# Patient Record
Sex: Female | Born: 1980 | Hispanic: Yes | Marital: Single | Smoking: Former smoker
Health system: Northeastern US, Academic
[De-identification: ages and names within clinical notes are randomized; demographics above are authoritative.]

---

## 2020-11-24 ENCOUNTER — Other Ambulatory Visit

## 2020-11-25 ENCOUNTER — Encounter (HOSPITAL_BASED_OUTPATIENT_CLINIC_OR_DEPARTMENT_OTHER): Admitting: Ophthalmology

## 2020-11-25 ENCOUNTER — Ambulatory Visit: Admit: 2020-11-25 | Payer: MEDICARE

## 2020-11-25 ENCOUNTER — Ambulatory Visit: Admit: 2020-11-25 | Payer: MEDICARE | Attending: Ophthalmology

## 2020-11-25 DIAGNOSIS — H5713 Ocular pain, bilateral: Secondary | ICD-10-CM

## 2020-11-25 MED ORDER — loteprednol (Lotemax) 0.5 % ophthalmic suspension
0.5 | Freq: Four times a day (QID) | OPHTHALMIC | 3 refills | 25.00000 days | Status: AC
Start: 2020-11-25 — End: 2021-11-25

## 2020-11-25 NOTE — Progress Notes (Signed)
Pain of both eyes  Self-referred, patient of Dr. Ronn Melena [facility], [place])  Symptoms of "light burning" OS>OD - started 1 year after LASIK OU (02/2019, initially just had dryness afterward) around the same time she was diagnosed with Lyme disease (2021), still on treatment with ceftriaxone. Also has daily headaches, facial pain left>right. Has had some improvement. Symptoms are worse at night time/while driving    Drop testing:  Baseline OD 2, OS 4  NACL 20 sec: OD 4, OS 5  Baseline before proparacaine: OD 4, OS 4  Proparacaine 90 sec: FB sensation, not stinging OD, OS 0    Previous treatments:  IPL x 5 - helped significantly  ATs - no significant improvement  Xiidra - no improvement, d/c'd by Dr. Altamese Cabal  Restasis - no improvement, d/c'd by Dr. 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  "Q" - helped   Gabapentin - previously on 800 mg TID, now on 300 mg TID with improvement  Planning to start LDN    HRT 11/25/20: decreased nerve density OD>OS, many inflammatory cells, large microneuroma OS    Allergies to grass, dust mites    ROS:  [x]  Numbness of fingers, hands, feet  [x]  Tingling of hands, feet, fingers  [x]  Ringing in the ears - previously, now resolved  [x]  Dizziness when getting up in the morning, or standing up  []  Blue fingertips or toes  [x]  Dry skin  []  Dry mouth or dry tongue  []  Problems with sweating  []  Bowel issues  [x]  Anxiety  []  Allodynia   [x]  Family history of autoimmune disease - sister with Sjogren's  []  Personal history of autoimmune disease - recent labs show RF negative, elevated B6, low vitamin D, Lyme positive     Plan for treatment:  - Send serologies  - Start Pataday or Pazeo daily OU  - Start Refresh PM or other lubricating ointment at bedtime OU   - Start Lotemax - apply one drop into each eye 4 times daily for  2 weeks, then decrease to 2 times daily for 2 weeks, then decrease and stay on once daily until follow up visit   - Recommend covers on bedding for dust mites, getting rid of carpet or plastic mat under bed posts  - Suggest adding LDN before stopping gabapentin    Recommend trial of meds x 6 weeks, then call to discuss symptoms and further treatment options (consider AST at that time)All diagnoses were discussed in detail with the patient including the details of today's exam and testing, treatment options, prognosis, and need for follow up as directed.  Importance of follow up and following prescribed medicines and directions we discussed. Risk of medications were reviewed as well as the importance to follow up to prevent vision loss. All questions were answered and patient expressed understanding. Patient to call with any new concerns.More than 50% of 60-min appt time spent face-to-face discussing diagnosis and treatment.      RV 2-3 months

## 2020-11-28 ENCOUNTER — Other Ambulatory Visit (HOSPITAL_BASED_OUTPATIENT_CLINIC_OR_DEPARTMENT_OTHER)

## 2020-11-28 MED ORDER — loteprednol etabonate (Lotemax SM) 0.38 % drops,gel
0.38 | Freq: Four times a day (QID) | OPHTHALMIC | 3 refills | 25.00000 days | Status: AC
Start: 2020-11-28 — End: ?

## 2020-11-28 NOTE — Telephone Encounter (Signed)
Mason Jim, MD  Veleta Miners  Cc: [facility] Rx Refill Request Pool  Hi   We can go with Lotemax SM. Same instructions   Thank you       ?     Previous Messages    ?  ----- Message -----   From: Veleta Miners   Sent: 11/28/2020 ?11:30 AM EDT   To: Mason Jim, MD, *   Subject: lotemax denied ? ? ? ? ? ? ? ? ? ? ? ? ? ? ?     Dr Tressia Danas,     I submitted a PA to both of her prescription drug insurances. ?([employer] and [employer]). ?Both denied the drug. ?The covered alternatives are listed below. ?If you need to appeal the decision, please send [place] a letter of medical necessity explaining why none of the alternatives are acceptable.       Lotemax SM   Pred forte 1%   Pred mild 0.12%   Dexamethasone 0.1%   FML 0.1%   FML liquifilm 0.25%   Diclofenac sodium   Tobradex     If you would like one of these alternatives, please specify dosing instructions.

## 2021-01-13 ENCOUNTER — Encounter (HOSPITAL_BASED_OUTPATIENT_CLINIC_OR_DEPARTMENT_OTHER): Admitting: Ophthalmology

## 2021-01-13 NOTE — Telephone Encounter (Signed)
 From: Jacinto Halim  To: Dr. Mason Jim  Sent: 01/13/2021 1:40 AM EDT  Subject: You wanted me to contact you at 6 weeks for update     Hi, my eyes are better but I do have little burn. Some days none but some days very little. My eyes have gotten better but still very little burning sometimes

## 2021-03-03 ENCOUNTER — Ambulatory Visit: Payer: MEDICARE | Attending: Ophthalmology

## 2021-03-13 ENCOUNTER — Ambulatory Visit: Payer: MEDICARE | Attending: Ophthalmology

## 2021-04-28 ENCOUNTER — Ambulatory Visit (HOSPITAL_BASED_OUTPATIENT_CLINIC_OR_DEPARTMENT_OTHER): Admitting: Ophthalmology

## 2021-04-28 ENCOUNTER — Other Ambulatory Visit: Admit: 2021-04-28 | Payer: MEDICARE

## 2021-04-28 ENCOUNTER — Other Ambulatory Visit

## 2021-04-28 ENCOUNTER — Ambulatory Visit: Admit: 2021-04-28 | Discharge: 2021-04-28 | Payer: MEDICARE | Attending: Ophthalmology

## 2021-04-28 ENCOUNTER — Encounter

## 2021-04-28 ENCOUNTER — Encounter (HOSPITAL_BASED_OUTPATIENT_CLINIC_OR_DEPARTMENT_OTHER): Admitting: Ophthalmology

## 2021-04-28 DIAGNOSIS — H5713 Ocular pain, bilateral: Secondary | ICD-10-CM

## 2021-04-28 LAB — IGG, IGA, IGM
IgA: 256 mg/dL (ref 70.0–400.0)
IgG: 1149 mg/dL (ref 700–1600)
IgM: 99 mg/dL (ref 40–230)

## 2021-04-28 LAB — VITAMIN B12: Vitamin B-12: 692 pg/mL (ref 193–986)

## 2021-04-28 LAB — IGE: IgE, total: 31 [IU]/mL (ref 0.0–100.0)

## 2021-04-28 LAB — RHEUMATOID FACTOR: Rheumatoid Factor: <15 [IU]/mL (ref <=15)

## 2021-04-28 LAB — ALBUMIN: Albumin: 4.4 g/dL (ref 3.2–5.0)

## 2021-04-28 LAB — PROTEIN, TOTAL: Protein, total: 7.8 g/dL (ref 6.0–8.4)

## 2021-04-28 LAB — C-REACTIVE PROTEIN: CRP: 11.05 mg/L — ABNORMAL HIGH (ref 0.00–7.48)

## 2021-04-28 LAB — HEMOGLOBIN A1C: HEMOGLOBIN A1C % (INT/EXT): 5 % (ref <5.6)

## 2021-04-28 NOTE — Progress Notes (Signed)
 Pain of both eyes  Self-referred, patient of Dr. Ronn Melena [facility], [place])  Symptoms of "light burning" OS>OD - started 1 year after LASIK OU (02/2019, initially just had dryness afterward) around the same time she was diagnosed with Lyme disease (2021), still on treatment with ceftriaxone. Also has daily headaches, facial pain left>right. Has had some improvement. Symptoms are worse at night time/while driving    Drop testing:  Baseline OD 2, OS 4  NACL 20 sec: OD 4, OS 5  Baseline before proparacaine: OD 4, OS 4  Proparacaine 90 sec: FB sensation, not stinging OD, OS 0    Previous treatments:  IPL x 5 - helped significantly  ATs - no significant improvement  Xiidra - no improvement, d/c'd by Dr. Altamese Cabal  Restasis - no improvement, d/c'd by Dr. 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  "Q" - helped   Gabapentin - previously on 800 mg TID, now on 300 mg TID with improvement  Planning to start LDN    HRT 11/25/20: decreased nerve density OD>OS, many inflammatory cells, large microneuroma OS    Allergies to grass, dust mites    ROS:  [x]  Numbness of fingers, hands, feet  [x]  Tingling of hands, feet, fingers  [x]  Ringing in the ears - previously, now resolved  [x]  Dizziness when getting up in the morning, or standing up  []  Blue fingertips or toes  [x]  Dry skin  []  Dry mouth or dry tongue  []  Problems with sweating  []  Bowel issues  [x]  Anxiety  []  Allodynia   [x]  Family history of autoimmune disease - sister with Sjogren's  []  Personal history of autoimmune disease - recent labs show RF negative, elevated B6, low vitamin D, Lyme positive     Plan for treatment:  - Send serologies  - Start Pataday or Pazeo daily OU  - Start Refresh PM or other lubricating ointment at bedtime OU   - Start Lotemax - apply one drop into each eye 4 times daily for  2 weeks, then decrease to 2 times daily for 2 weeks, then decrease and stay on once daily until follow up visit   - Recommend covers on bedding for dust mites, getting rid of carpet or plastic mat under bed posts  - Suggest adding LDN before stopping gabapentin    Recommend trial of meds x 6 weeks, then call to discuss symptoms and further treatment options (consider AST at that time)All diagnoses were discussed in detail with the patient including the details of today's exam and testing, treatment options, prognosis, and need for follow up as directed.  Importance of follow up and following prescribed medicines and directions we discussed. Risk of medications were reviewed as well as the importance to follow up to prevent vision loss. All questions were answered and patient expressed understanding. Patient to call with any new concerns.More than 50% of 60-min appt time spent face-to-face discussing diagnosis and treatment.      RV 2-3 months    04/28/21  Pain overall improved around 50%. Has had 3 days with zero pain. Also had a flare-up 2 days ago. Pain is now on and off.    Plan for treatment:  - Send serologies  - cont Pataday daily OU  - cont Refresh PM   -  cont Lotemax QOD  - cont covers on bedding for dust mites  F/U 6 months

## 2021-04-28 NOTE — Addendum Note (Signed)
 Addended byOtho Ket on: 04/28/2021 03:17 PM     Modules accepted: Orders

## 2021-04-29 LAB — PROTEIN ELECTROPHORESIS, SERUM
Albumin (SPEP): 4.49 g/dL (ref 3.50–5.10)
Alpha 1 (SPEP): 0.26 g/dL (ref 0.10–0.30)
Alpha 2 (SPEP): 0.91 g/dL (ref 0.50–1.00)
Beta (SPEP): 1.06 g/dL (ref 0.50–1.10)
Gamma Globulin (SPEP): 1.08 g/dL (ref 0.50–1.30)

## 2021-04-29 LAB — ANA (ANTINUCLEAR ANTIBODIES): ANA: NONREACTIVE

## 2021-04-30 ENCOUNTER — Other Ambulatory Visit (HOSPITAL_BASED_OUTPATIENT_CLINIC_OR_DEPARTMENT_OTHER)

## 2021-04-30 ENCOUNTER — Encounter (HOSPITAL_BASED_OUTPATIENT_CLINIC_OR_DEPARTMENT_OTHER): Admitting: Ophthalmology

## 2021-05-01 LAB — ANTI-NEUTROPHILIC CYTOPLASMIC ANTIBODY
A-ANCA: <1:20 {titer}
C-ANCA: <1:20 {titer}
P-ANCA: <1:20 {titer}

## 2021-05-02 LAB — SSA (RO): SSA (Ro): <0.4 {ELISA'U} (ref <7.0)

## 2021-05-02 LAB — TISSUE TRANSGLUTAMINASE, IGG
Tissue Transglutaminase, IgG Interp: NEGATIVE
Tissue Transglutaminase, IgG: <0.6 U/mL (ref <7.0)

## 2021-05-02 LAB — TISSUE TRANSGLUTAMINASE, IGA
Tissue Transglutaminase, IgA Interp: NEGATIVE
Tissue Transglutaminase, IgA: 0.3 {ELISA'U}

## 2021-05-02 LAB — SSB (LA): SSB (La): <0.4 {ELISA'U} (ref <7.0)

## 2021-05-03 LAB — ANGIOTENSIN CONVERTING ENZYME: Angiotensin Converting Enzyme: 25.3 U/L (ref 9–67)

## 2021-05-04 LAB — VITAMIN D 25 HYDROXY
Vit D, 25-Hydroxy: 30 ng/mL (ref 20–100)
Vitamin D 25-OH, D2: <2 ng/mL
Vitamin D 25-OH, D3: 30 ng/mL

## 2021-05-04 LAB — VITAMIN B6, PLASMA: Vitamin B6, Plasma: 39.5 ng/mL — ABNORMAL HIGH (ref 2.1–21.7)

## 2021-05-04 NOTE — Telephone Encounter (Signed)
 From: Jacinto Halim  To: Dr. Mason Jim  Sent: 04/30/2021 12:27 AM EST  Subject: Question regarding C-REACTIVE PROTEIN    Why is this so high?? Could it be Lyme disease?? Or what could cause this ??

## 2021-05-05 ENCOUNTER — Telehealth (HOSPITAL_BASED_OUTPATIENT_CLINIC_OR_DEPARTMENT_OTHER): Admitting: Student in an Organized Health Care Education/Training Program

## 2021-05-05 LAB — VITAMIN B2 (RIBOFLAVIN) LEVEL: Vitamin B2 (Riboflavin), Plasma: 11.1 nmol/L (ref 6.2–39.0)

## 2021-05-05 LAB — GLIADIN ANTIBODIES, SERUM
Gliadin (Deamidated) Ab IgA: <1 U/mL
Gliadin (Deamidated) Ab IgG: <1 U/mL

## 2021-05-05 NOTE — Telephone Encounter (Addendum)
 Patient reassured. Educated about CRP. Discussed refraining from B6 supplements. OK to take D3 supplements.    Regarding: FW: Question regarding C-REACTIVE PROTEIN  Contact: [phone]  Good morning,  Can you please reach out to this patient to answer the questions/concerns below?    Best,    Irineo Axon  ----- Message -----  From: Theresa Oconnor  Sent: 05/04/2021   4:15 PM EST  To: [facility] Clinical Support Pool  Subject: Question regarding C-REACTIVE PROTEIN            I?m already positive for Lyme now. I?m taking antibiotics by IV currently. I?ve been positive for a year and a half now. That?s why I?m asking if that could be the caused

## 2021-05-08 ENCOUNTER — Other Ambulatory Visit (HOSPITAL_BASED_OUTPATIENT_CLINIC_OR_DEPARTMENT_OTHER): Admitting: Ophthalmology

## 2021-05-11 ENCOUNTER — Encounter (HOSPITAL_BASED_OUTPATIENT_CLINIC_OR_DEPARTMENT_OTHER)

## 2021-05-11 NOTE — Telephone Encounter (Signed)
-----   Message from Mason Jim, MD sent at 05/09/2021 10:23 AM EST -----  Regarding: FW:  Also had high B6 levels   Should see PCP to evaluate  ----- Message -----  From: Lab, Background User  Sent: 04/28/2021   5:21 PM EST  To: Mason Jim, MD

## 2021-10-20 ENCOUNTER — Ambulatory Visit: Payer: MEDICARE | Attending: Ophthalmology

## 2021-10-20 ENCOUNTER — Encounter

## 2022-02-17 NOTE — Telephone Encounter (Signed)
From: Jacinto Halim  To: Dr. Mason Jim  Sent: 02/12/2022 9:46 PM EDT  Subject: My Blood drops for my eyes     Hi, I would like dr Tressia Danas to call in the blood drawer company that can draw my blood to make blood drops to put in my eyes. He said he would if I needed it. Thanks Jacinto Halim   My address has changed it's [address] [place] [address] thanks

## 2022-02-18 MED ORDER — autologous serum tears 20 %
20 | OPHTHALMIC | 11 refills | 30.00000 days | Status: AC | PRN
Start: 2022-02-18 — End: ?

## 2023-01-26 IMAGING — DX XR CHEST 1 VIEW
1 series · 1 of 1 positions shown · non-contrast
Comparison: None

FINAL REPORT:
EXAM: XR CHEST 1 VIEW
CLINICAL INDICATION: CP Protocol

[AP]
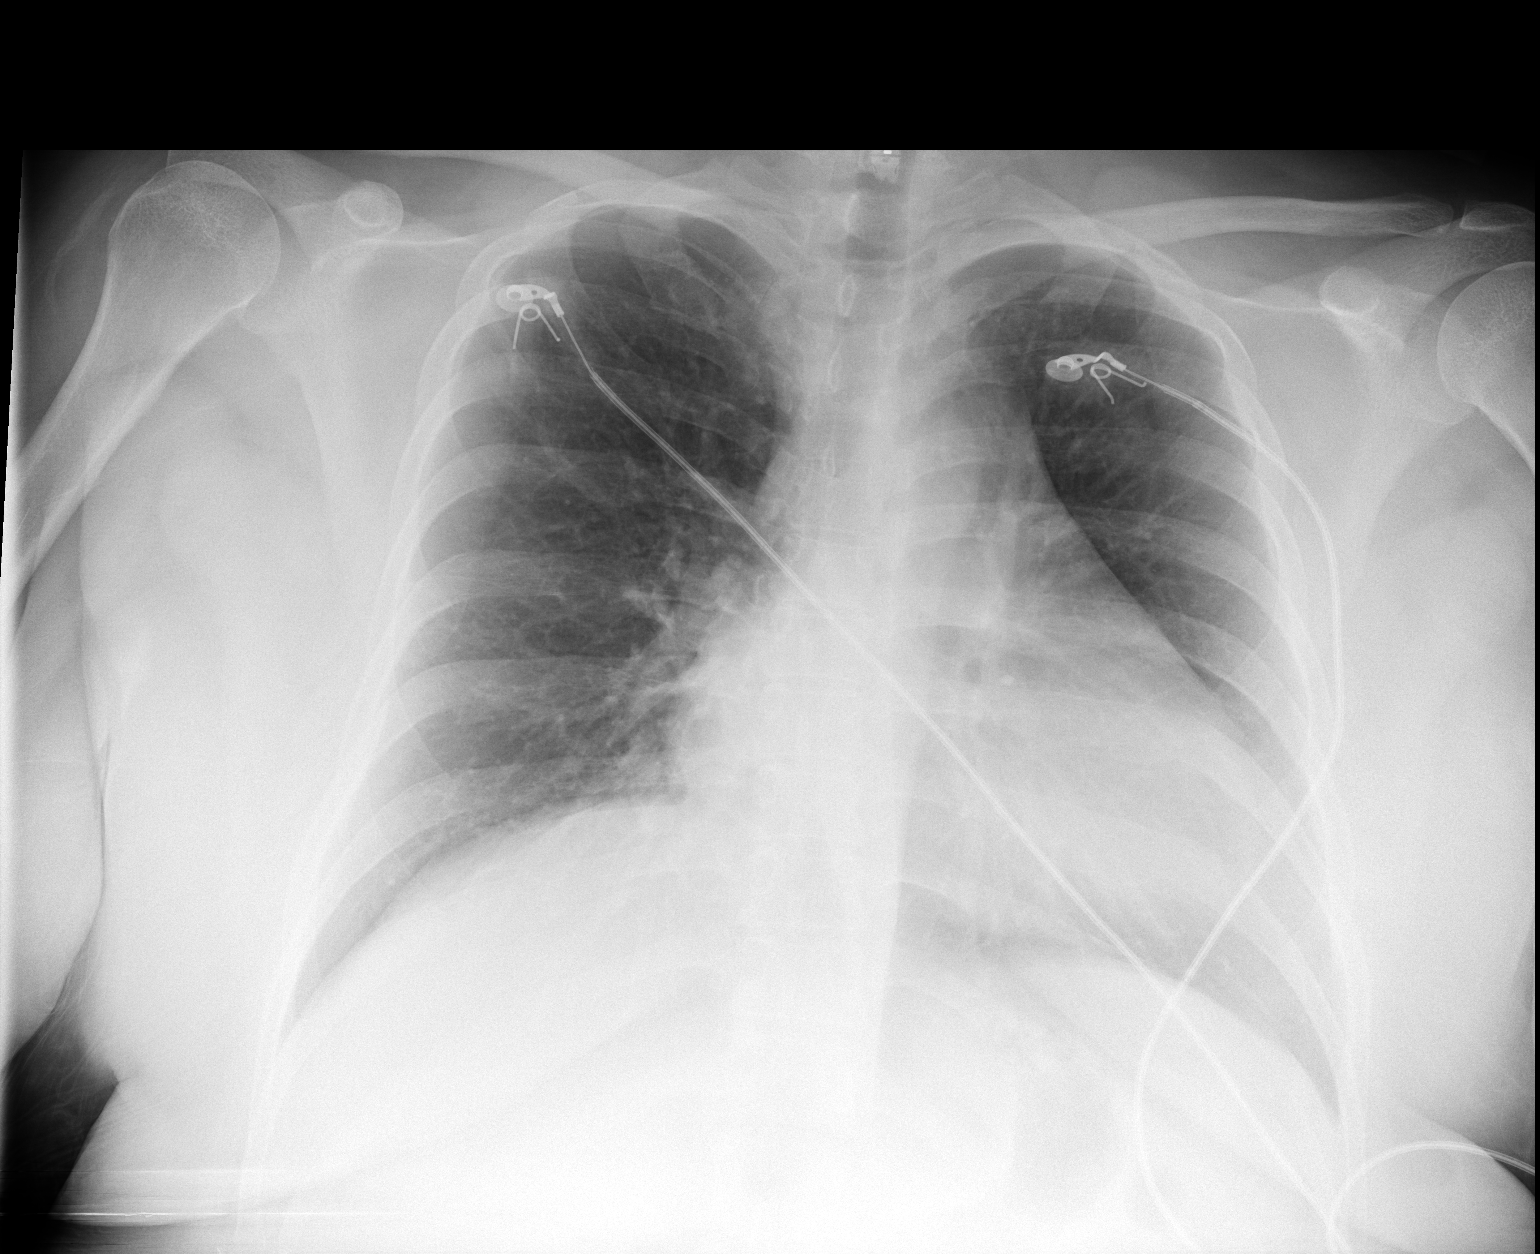

[1 of 1 positions shown; findings below may reference images not displayed]

FINDINGS: Support Devices: None
Lungs/Pleura: No focal consolidation.  No pneumothorax. No pleural effusion.
Heart/Mediastinum: Normal.
Bones/Soft tissues: No acute abnormality.
IMPRESSION: No acute cardiopulmonary abnormality.
Is the patient pregnant?
No

## 2023-03-31 IMAGING — MR MRI CERVICAL SPINE WITHOUT CONTRAST
8 series · 48 of 48 positions shown · non-contrast
Comparison: none

Neck and lower back pain hx of ACD on neck
FINAL REPORT:
MRI of the cervical spine without contrast
HISTORY: Radiculopathy, cervical region
TECHNIQUE: Multiplanar and multisequence images were obtained through the cervical spine without contrast.

[Series 1001: survey · coronal · 1.7mm · 1.67mm/px · 19 of 96 slices shown]
[im 1/96]
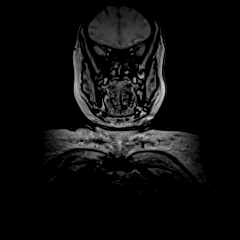
[im 6/96]
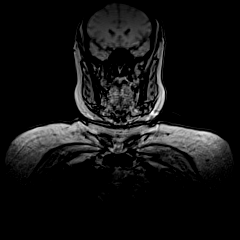
[im 11/96]
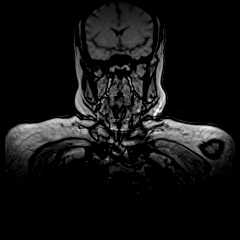
[im 16/96]
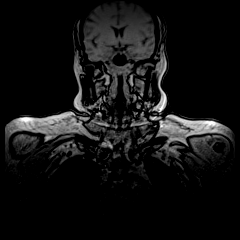
[im 22/96]
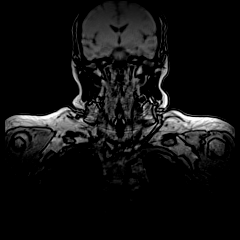
[im 27/96]
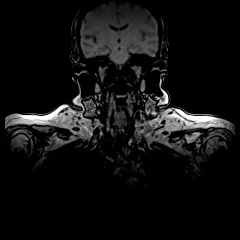
[im 32/96]
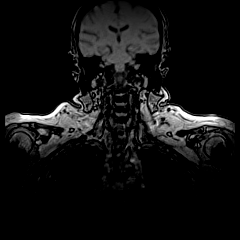
[im 37/96]
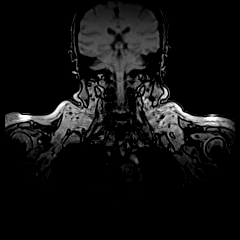
[im 43/96]
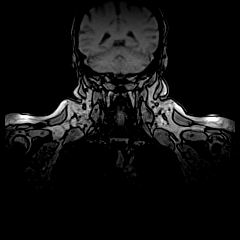
[im 48/96]
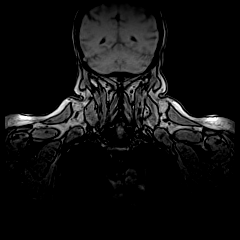
[im 53/96]
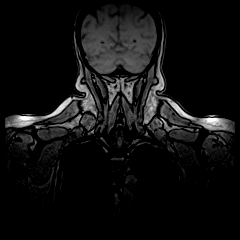
[im 59/96]
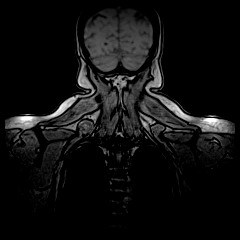
[im 64/96]
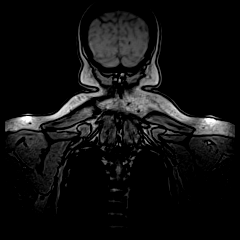
[im 69/96]
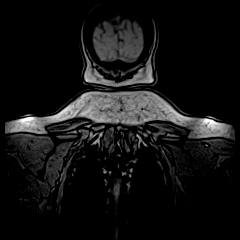
[im 74/96]
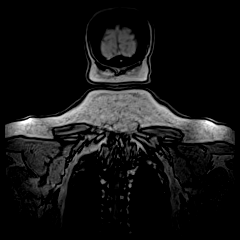
[im 80/96]
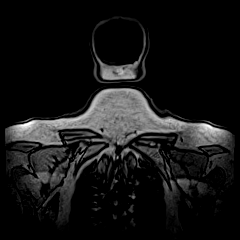
[im 85/96]
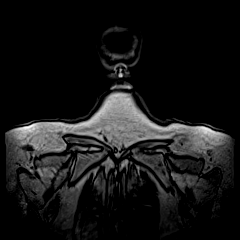
[im 90/96]
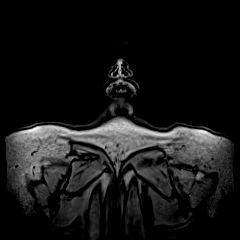
[im 96/96]
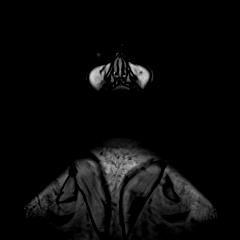

[Series 3001: survey_mpr_sag · sagittal · 1.7mm · 1.67mm/px · 3 of 15 slices shown]
[im 1/15]
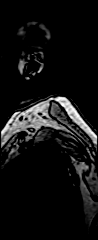
[im 8/15]
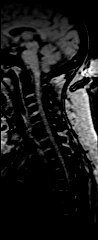
[im 15/15]
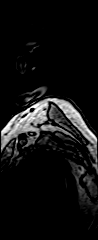

[Series 4001: survey_mpr_(person_name) · axial · 1.7mm · 1.67mm/px · 1 of 7 slices shown]
[im 1/7]
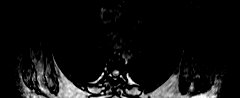

[Series 5001: T2 · sagittal · 3.0mm · 0.69mm/px · 3 of 15 slices shown (1 of 2)]
[im 1/15]
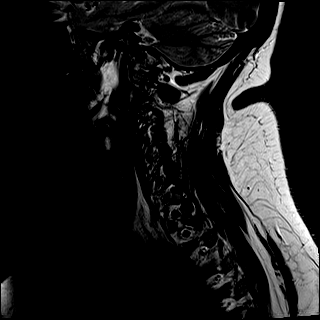
[im 8/15]
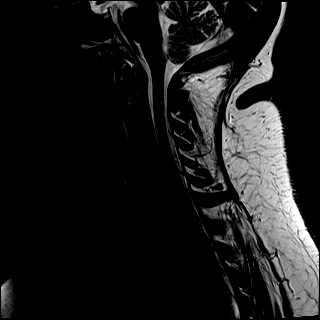
[im 15/15]
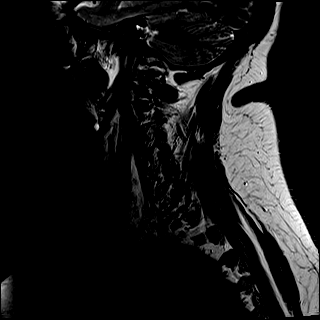

[Series 6001: T1 · sagittal · 3.0mm · 0.62mm/px · 3 of 15 slices shown]
[im 1/15]
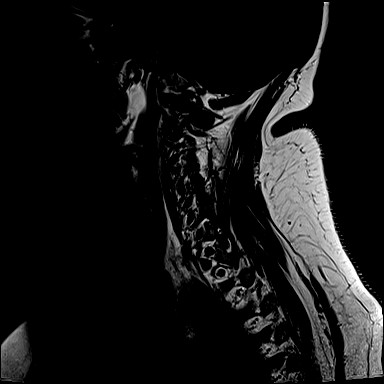
[im 8/15]
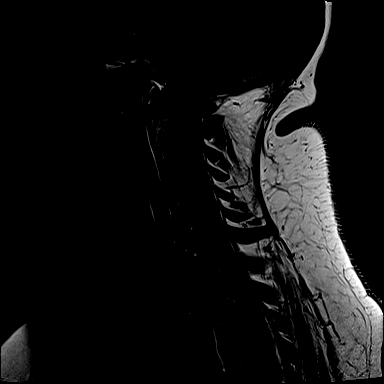
[im 15/15]
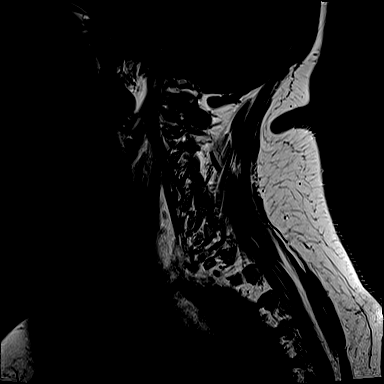

[Series 7001: STIR · sagittal · 3.0mm · 0.43mm/px · 3 of 15 slices shown]
[im 1/15]
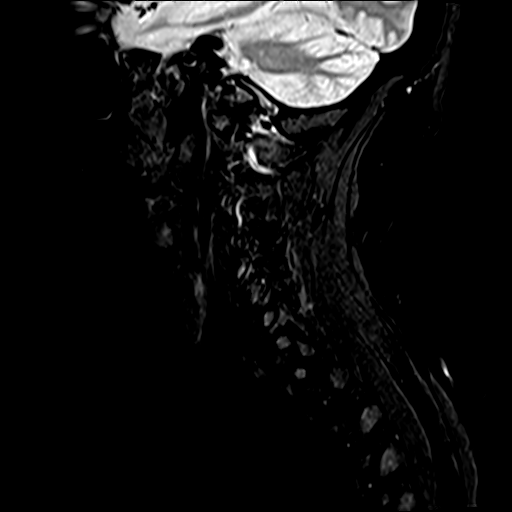
[im 8/15]
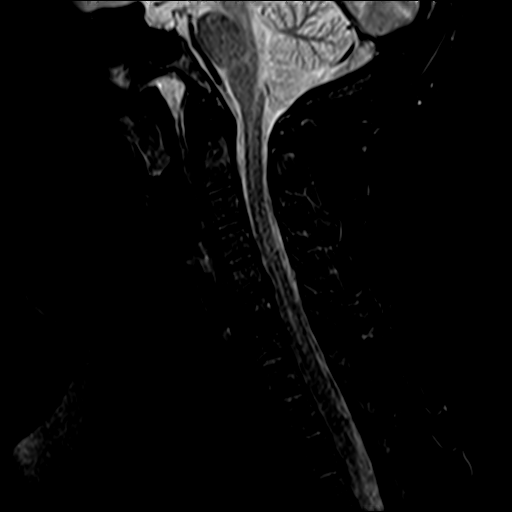
[im 15/15]
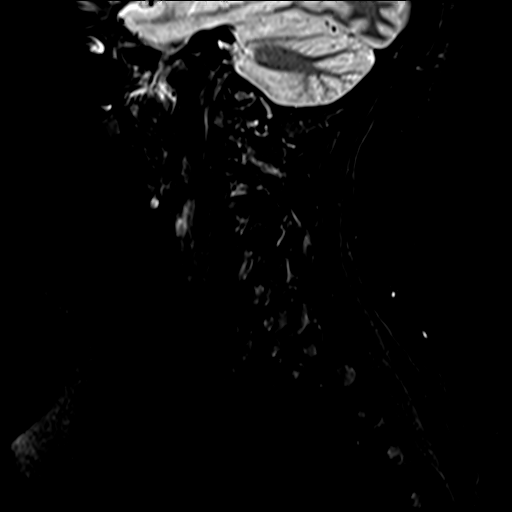

[Series 8001: GRE · axial · 3.0mm · 0.70mm/px · z∈[-57,+43]mm · 6 of 32 slices shown]
[im 1/32]
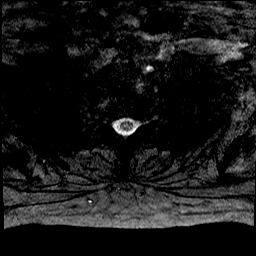
[im 7/32]
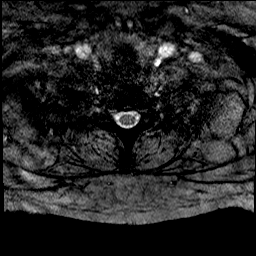
[im 13/32]
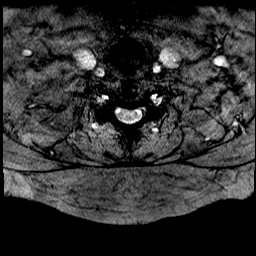
[im 19/32]
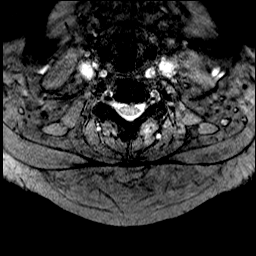
[im 25/32]
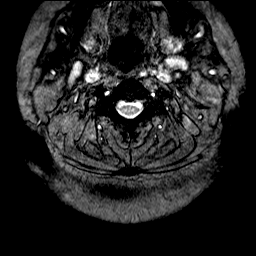
[im 32/32]
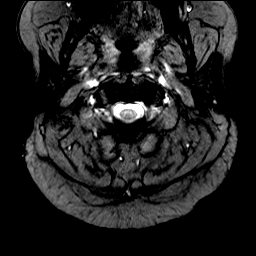

[Series 9001: T2 · axial · 2.0mm · 0.70mm/px · z∈[-57,+43]mm · 10 of 52 slices shown (2 of 2)]
[im 1/52]
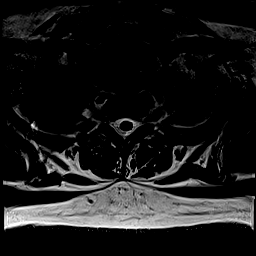
[im 6/52]
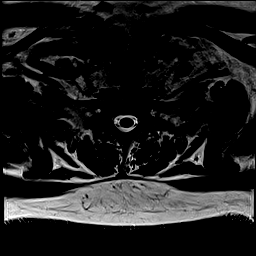
[im 12/52]
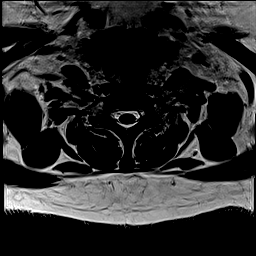
[im 18/52]
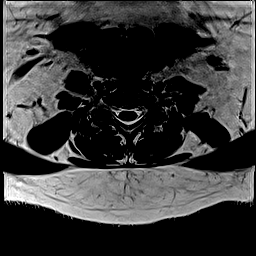
[im 23/52]
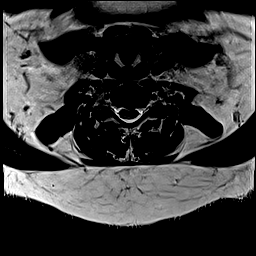
[im 29/52]
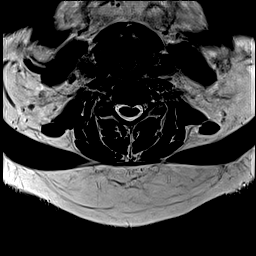
[im 35/52]
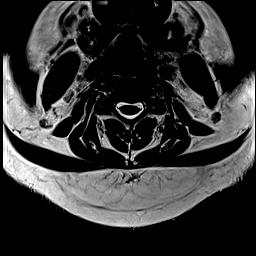
[im 40/52]
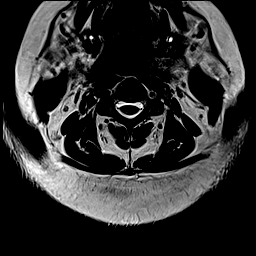
[im 46/52]
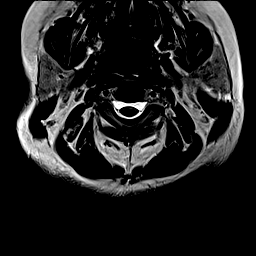
[im 52/52]
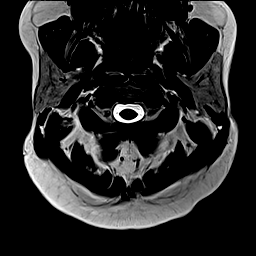

[48 of 48 positions shown; findings below may reference images not displayed]

FINDINGS: Cervical spine and cervical thoracic junction is in anatomic alignment. There is discectomy at C6-7. Cervical vertebral body heights preserved. No marrow edema in the cervical vertebra. No abnormal T2 hypertense signal in the cervical spinal cord. The imaged portions of the posterior fossa contents are unremarkable. The imaged paravertebral soft tissue structures are grossly unremarkable.
At C2-3 there is no significant spinal canal or neuroforaminal stenosis.
At C3-4 there is no significant spinal canal or neuroforaminal stenosis.
At C4-5 there is a small left paracentral disc protrusion mildly effacing the ventral thecal sac causing borderline spinal canal narrowing. No significant neuroforaminal stenosis.
At C5-6 there is a left paracentral disc osteophyte complex mildly effacing the ventral thecal sac causing mild spinal canal stenosis. No significant neuroforaminal stenosis.
At C6-7 there is no significant spinal canal or neuroforaminal stenosis.
At C7-T1 there is no significant spinal canal or neuroforaminal stenosis.
IMPRESSION: 1. Mild spinal canal stenosis at C5-6.
2. Borderline spinal canal narrowing at C4-5.
Is the patient pregnant?
No

## 2023-03-31 IMAGING — MR MRI LUMBAR SPINE WITHOUT CONTRAST
5 of 6 series · 34 of 48 positions shown · non-contrast
Comparison: none

Neck and lower back pain hx of ACD on neck
FINAL REPORT:
MRI of the lumbar spine without contrast
HISTORY: Radiculopathy, cervical region
TECHNIQUE: Multiplanar and multisequence images were acquired through the lumbar spine without contrast.

[Series 3001: T2 · sagittal · 4.0mm · 0.70mm/px · 7 of 17 slices shown (1 of 2)]
[im 1/17]
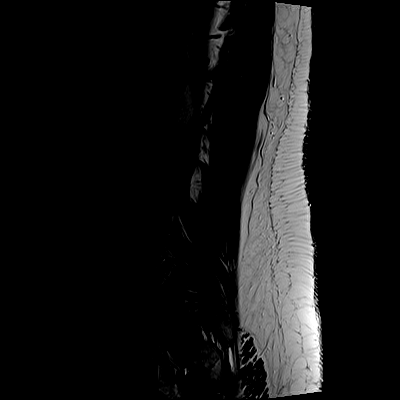
[im 3/17]
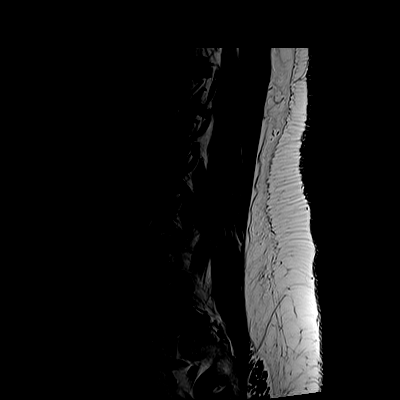
[im 6/17]
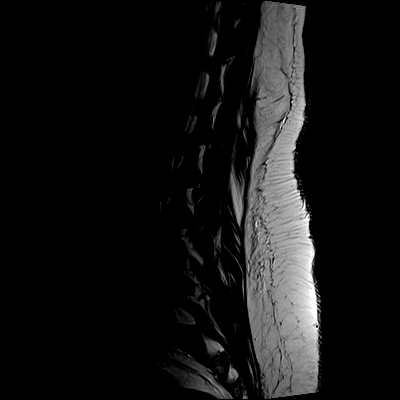
[im 9/17]
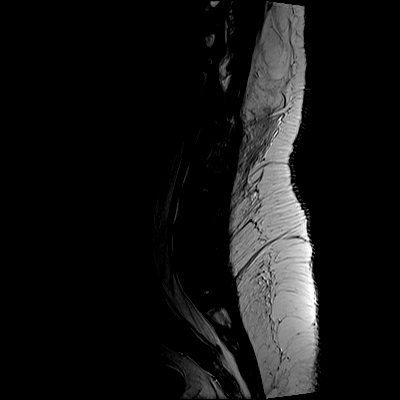
[im 11/17]
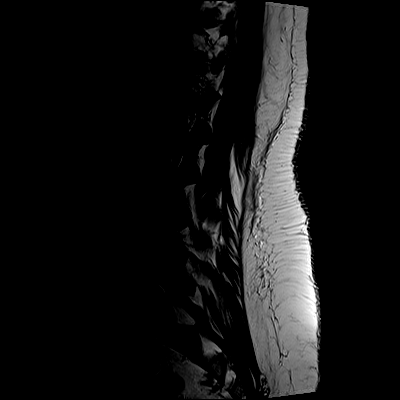
[im 14/17]
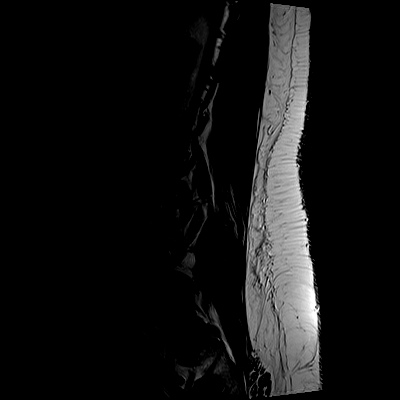
[im 17/17]
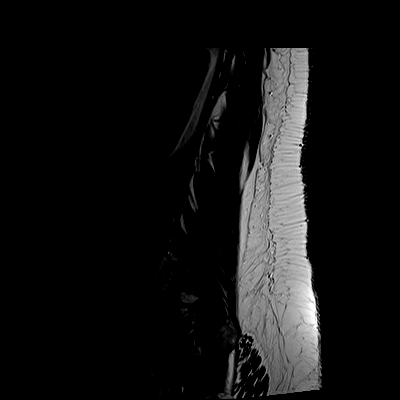

[Series 4001: STIR · sagittal · 4.0mm · 0.83mm/px · 2 of 17 slices shown]
[im 1/17]
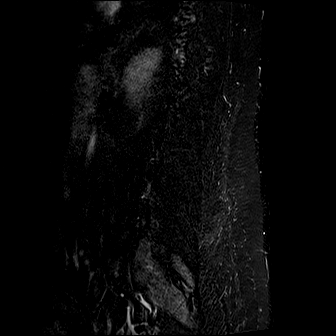
[im 3/17]
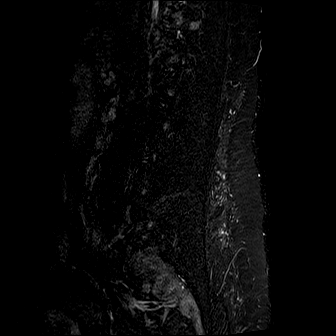

[Series 5001: T1 · sagittal · 4.0mm · 0.73mm/px · 7 of 17 slices shown (1 of 2)]
[im 1/17]
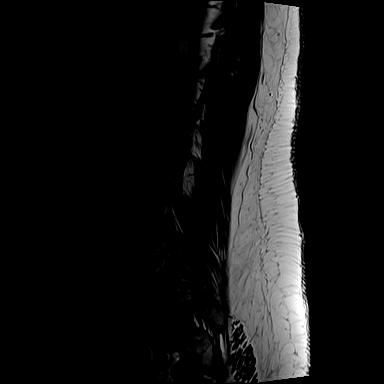
[im 3/17]
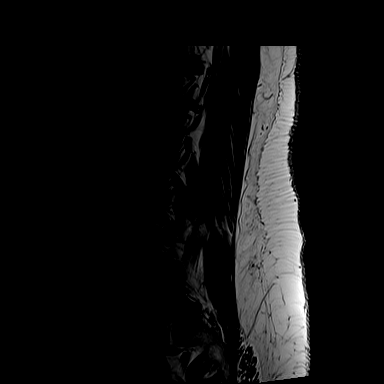
[im 6/17]
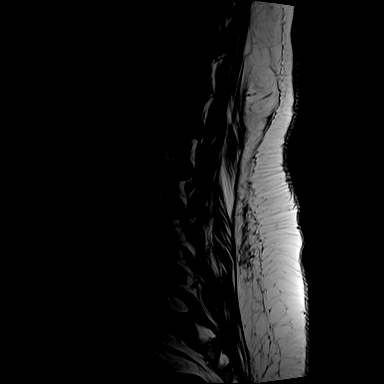
[im 9/17]
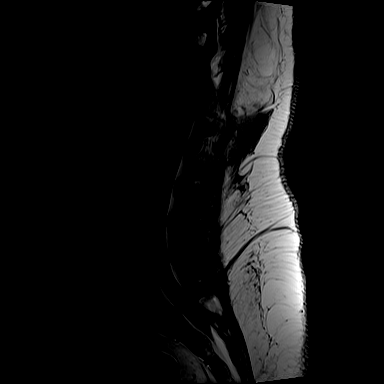
[im 11/17]
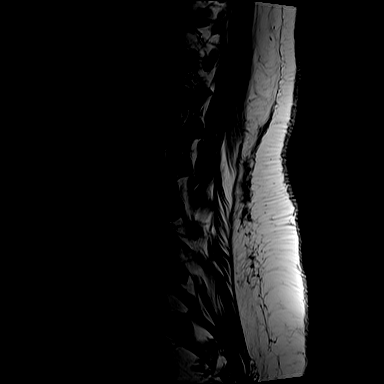
[im 14/17]
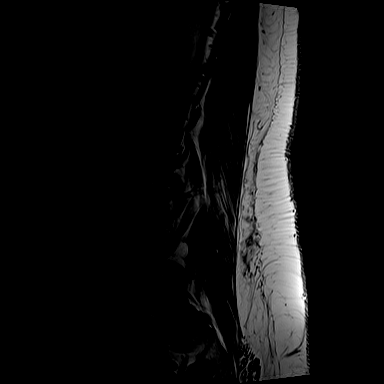
[im 17/17]
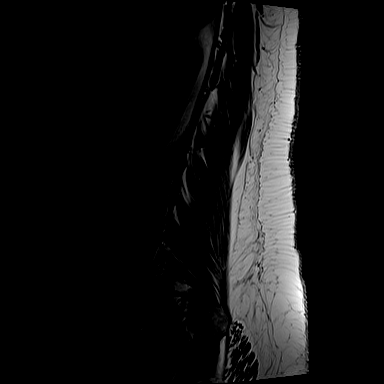

[Series 6001: T2 · axial · 4.0mm · 0.57mm/px · z∈[-462,-272]mm · 9 of 32 slices shown (2 of 2)]
[im 1/32]
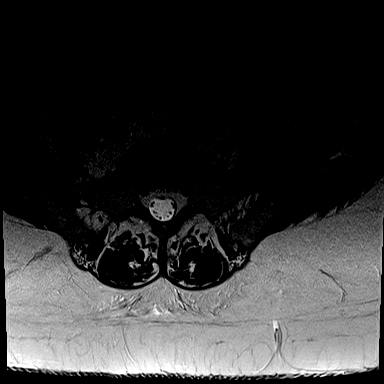
[im 6/32]
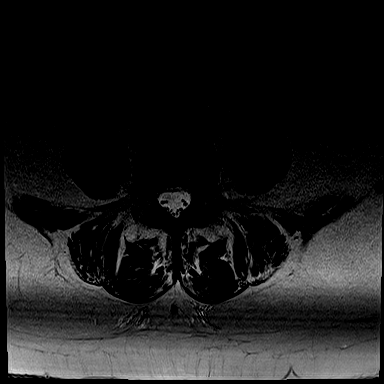
[im 9/32]
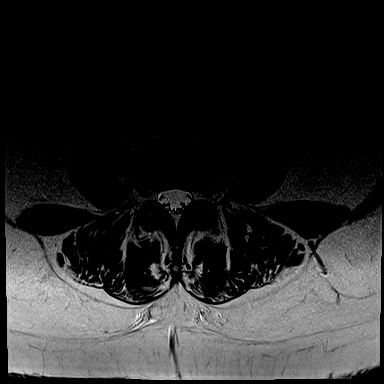
[im 15/32]
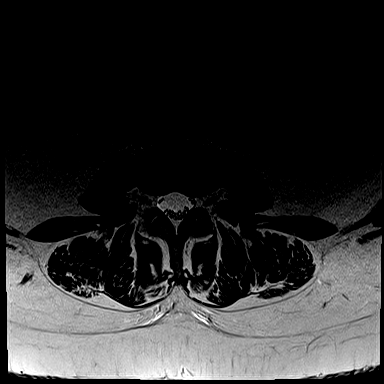
[im 17/32]
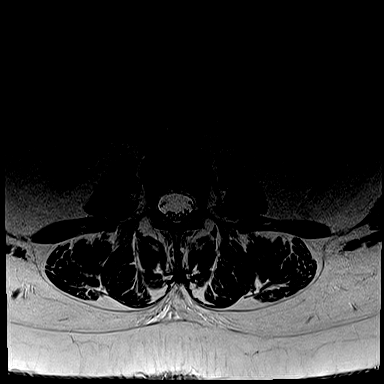
[im 23/32]
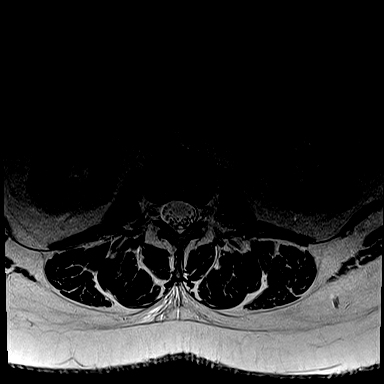
[im 26/32]
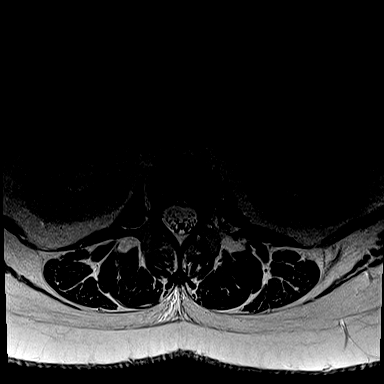
[im 29/32]
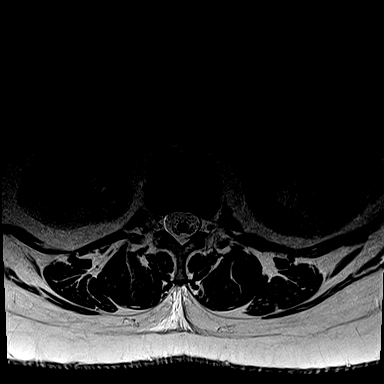
[im 32/32]
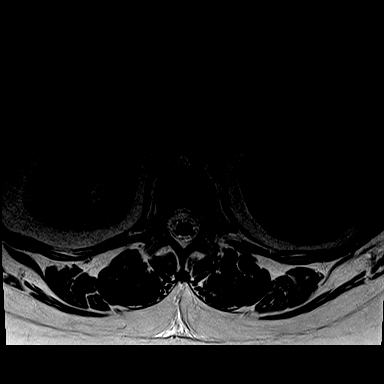

[Series 7001: T1 · axial · 4.0mm · 0.57mm/px · z∈[-462,-272]mm · 9 of 32 slices shown (2 of 2)]
[im 1/32]
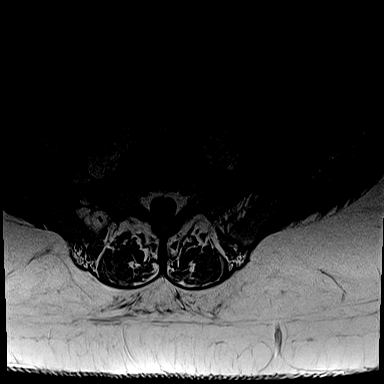
[im 6/32]
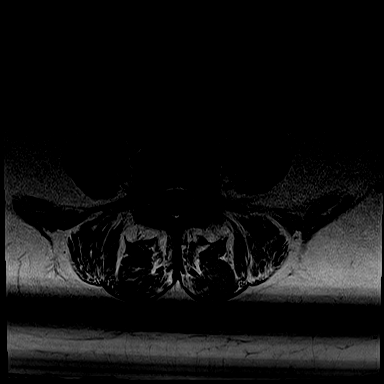
[im 9/32]
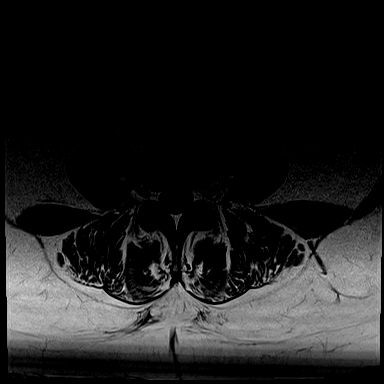
[im 15/32]
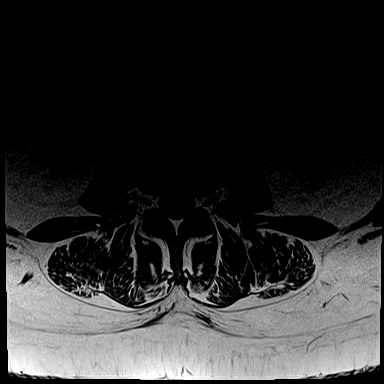
[im 17/32]
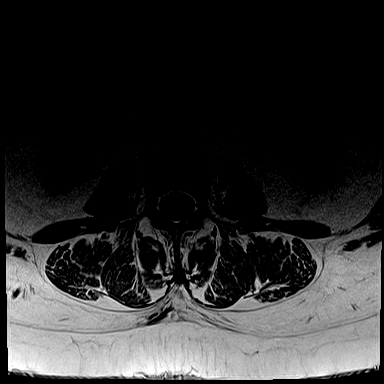
[im 23/32]
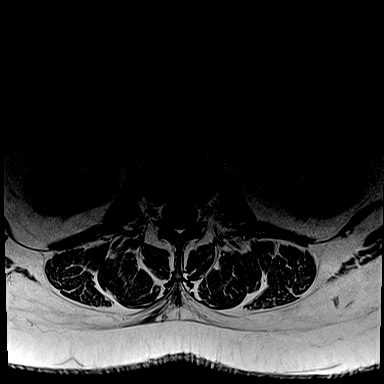
[im 26/32]
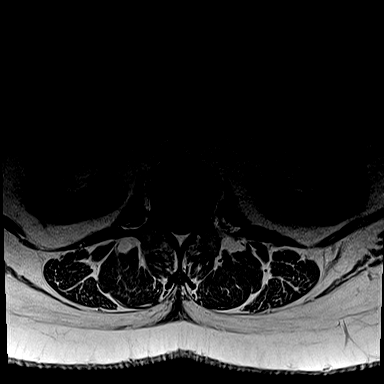
[im 29/32]
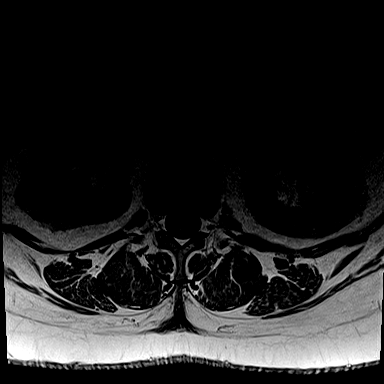
[im 32/32]
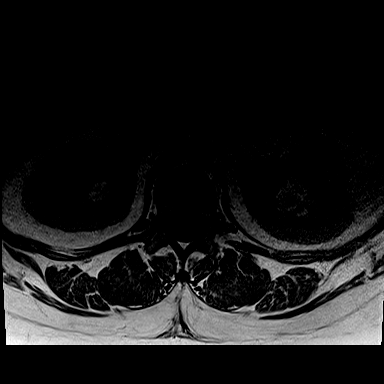

[34 of 48 positions shown; findings below may reference images not displayed]

FINDINGS: For the purposes of this exam there are assumed to be 5 nonrib-bearing lumbar vertebra with the most inferior fully articulating disc designated as L5-S1. Conus medullaris terminates at L2. The lumbar spine is in anatomic alignment. Lumbar vertebral body heights are preserved. No marrow edema in the lumbar spine. No pars defects. The imaged paravertebral soft tissue structures are grossly unremarkable.
At L1-2 there is a slight posterior disc bulge without significant spinal canal or neuroforaminal stenosis.
At L2-3 there is no significant spinal canal or neuroforaminal stenosis.
At L3-4 there is mild facet hypertrophy without significant spinal canal or neuroforaminal stenosis.
At L4-5 there is mild facet hypertrophy without significant spinal canal or neuroforaminal stenosis.
At L5-S1 there is a small central disc protrusion with mild to moderate facet hypertrophy without significant spinal canal or neuroforaminal stenosis.
IMPRESSION: No significant spinal canal or neuroforaminal stenosis in the lumbar spine.
Is the patient pregnant?
No
